# Patient Record
Sex: Male | Born: 1945 | Race: White | Hispanic: No | Marital: Married | Smoking: Never smoker
Health system: Southern US, Community
[De-identification: ages and names within clinical notes are randomized; demographics above are authoritative.]

## PROBLEM LIST (undated history)

## (undated) DIAGNOSIS — F32A Depression, unspecified: Secondary | ICD-10-CM

## (undated) DIAGNOSIS — I10 Essential (primary) hypertension: Secondary | ICD-10-CM

## (undated) DIAGNOSIS — E119 Type 2 diabetes mellitus without complications: Secondary | ICD-10-CM

## (undated) DIAGNOSIS — F329 Major depressive disorder, single episode, unspecified: Secondary | ICD-10-CM

## (undated) DIAGNOSIS — I219 Acute myocardial infarction, unspecified: Secondary | ICD-10-CM

## (undated) DIAGNOSIS — J45909 Unspecified asthma, uncomplicated: Secondary | ICD-10-CM

## (undated) HISTORY — DX: Depression, unspecified: F32.A

## (undated) HISTORY — PX: HERNIA REPAIR: SHX51

## (undated) HISTORY — DX: Essential (primary) hypertension: I10

## (undated) HISTORY — DX: Major depressive disorder, single episode, unspecified: F32.9

## (undated) HISTORY — DX: Acute myocardial infarction, unspecified: I21.9

## (undated) HISTORY — DX: Type 2 diabetes mellitus without complications: E11.9

## (undated) HISTORY — DX: Unspecified asthma, uncomplicated: J45.909

## (undated) HISTORY — PX: CORONARY ARTERY BYPASS GRAFT: SHX141

---

## 2010-11-03 ENCOUNTER — Encounter (INDEPENDENT_AMBULATORY_CARE_PROVIDER_SITE_OTHER): Payer: PRIVATE HEALTH INSURANCE

## 2010-11-03 DIAGNOSIS — I2510 Atherosclerotic heart disease of native coronary artery without angina pectoris: Secondary | ICD-10-CM

## 2010-11-03 DIAGNOSIS — I712 Thoracic aortic aneurysm, without rupture: Secondary | ICD-10-CM

## 2010-11-14 DIAGNOSIS — I712 Thoracic aortic aneurysm, without rupture: Secondary | ICD-10-CM

## 2010-11-14 DIAGNOSIS — I359 Nonrheumatic aortic valve disorder, unspecified: Secondary | ICD-10-CM

## 2010-11-14 DIAGNOSIS — I2510 Atherosclerotic heart disease of native coronary artery without angina pectoris: Secondary | ICD-10-CM

## 2010-12-27 ENCOUNTER — Encounter (INDEPENDENT_AMBULATORY_CARE_PROVIDER_SITE_OTHER): Payer: PRIVATE HEALTH INSURANCE

## 2010-12-27 DIAGNOSIS — I712 Thoracic aortic aneurysm, without rupture, unspecified: Secondary | ICD-10-CM

## 2010-12-27 DIAGNOSIS — I2510 Atherosclerotic heart disease of native coronary artery without angina pectoris: Secondary | ICD-10-CM

## 2010-12-28 NOTE — Assessment & Plan Note (Unsigned)
[facility]IS[facility]  Quindell, Shere Nicklos DOB:  01/21/1946                                        December 27, 2010 CHART #:  [id number]  We saw this patient in the office today following his dental procedure and CABG.  The patient is doing well.  His sternum is stable.  His wounds are well healed.  He is feeling well.  He has been a little bit weak, but has been gradually improving every day.  He has seen the cardiologist and they are happy with the progress.  He has been walking for exercise at home.  He does plan to return to work after 3 months and he was counseled to continue following his sternal precautions until that time limiting his weight lifting to 15 pounds.  He will return to see [place] on a p.r.n. basis and he will continue to follow up with the cardiologist.  Tera Mater. Arvilla Market, MD  BC/MEDQ  D:  12/27/2010  T:  12/28/2010  Job:  [id number]

## 2011-01-29 ENCOUNTER — Encounter (INDEPENDENT_AMBULATORY_CARE_PROVIDER_SITE_OTHER): Payer: PRIVATE HEALTH INSURANCE

## 2011-01-29 DIAGNOSIS — I2510 Atherosclerotic heart disease of native coronary artery without angina pectoris: Secondary | ICD-10-CM
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
## 2015-04-07 DIAGNOSIS — I4892 Unspecified atrial flutter: Secondary | ICD-10-CM | POA: Insufficient documentation

## 2015-04-07 DIAGNOSIS — I10 Essential (primary) hypertension: Secondary | ICD-10-CM | POA: Insufficient documentation

## 2015-04-07 DIAGNOSIS — Z952 Presence of prosthetic heart valve: Secondary | ICD-10-CM | POA: Insufficient documentation

## 2015-09-09 ENCOUNTER — Ambulatory Visit (INDEPENDENT_AMBULATORY_CARE_PROVIDER_SITE_OTHER): Payer: Worker's Compensation | Admitting: Family Medicine

## 2015-09-09 ENCOUNTER — Ambulatory Visit: Payer: Worker's Compensation

## 2015-09-09 VITALS — BP 120/70 | HR 68 | Temp 97.8°F | Resp 18 | Ht 72.5 in | Wt 225.0 lb

## 2015-09-09 DIAGNOSIS — S3210XA Unspecified fracture of sacrum, initial encounter for closed fracture: Secondary | ICD-10-CM

## 2015-09-09 DIAGNOSIS — M25552 Pain in left hip: Secondary | ICD-10-CM

## 2015-09-09 DIAGNOSIS — M533 Sacrococcygeal disorders, not elsewhere classified: Secondary | ICD-10-CM | POA: Diagnosis not present

## 2015-09-09 DIAGNOSIS — S322XXA Fracture of coccyx, initial encounter for closed fracture: Secondary | ICD-10-CM

## 2015-09-09 MED ORDER — OXYCODONE-ACETAMINOPHEN 7.5-325 MG PO TABS: 1.0000 | ORAL_TABLET | ORAL | Status: DC | PRN

## 2015-09-09 MED ORDER — CYCLOBENZAPRINE HCL 5 MG PO TABS: 5.0000 mg | ORAL_TABLET | Freq: Three times a day (TID) | ORAL | Status: AC | PRN

## 2015-09-09 NOTE — Patient Instructions (Addendum)
Your x-ray is very suspicious for a fracture of the coccyx. These usually takes several weeks to heal and can be very difficult pain wise. I'm referring her to an orthopedic surgeon to see what other measures we can take besides pain medicine which I am prescribing today.  In addition there are some changes of your right hip that need further evaluation by your primary care provider.

## 2015-09-09 NOTE — Progress Notes (Signed)
 @  By signing my name below, I, William Terrell, attest that this documentation has been prepared under the direction and in the presence of Elvina Sidle, MD.  Electronically Signed: Andrew Au, ED Scribe. 09/09/2015. 1:37 PM.  Patient ID: William Terrell MRN: [id number], DOB: 03/10/1946, 70 y.o. Date of Encounter: 09/09/2015, 1:37 PM  Primary Physician: PROVIDER NOT IN SYSTEM  Chief Complaint:  Chief Complaint  Patient presents with  . Back Pain    fall from truck  . Hip Pain    HPI: 70 y.o. year old male with history below presents with an injury that occurred at work yesterday. Pt states his right foot slipped while getting out of his truck cabin yesterday causing him to fall and landed on his left hip. He now has left hip pain and low back pain described as soreness. He reports hx of back surgery. He denies leg weakness or numbness.   PCP-Dr. Alben Spittle.   Past Medical History  Diagnosis Date  . Asthma   . Depression   . Myocardial infarction [facility])      Home Meds: Prior to Admission medications   Not on File    Allergies:  Allergies  Allergen Reactions  . Oxycodone Other (See Comments)    Hallucinations    Social History   Social History  . Marital Status: Married    Spouse Name: N/A  . Number of Children: N/A  . Years of Education: N/A   Occupational History  . Not on file.   Social History Main Topics  . Smoking status: Never Smoker   . Smokeless tobacco: Not on file  . Alcohol Use: Not on file  . Drug Use: Not on file  . Sexual Activity: Not on file   Other Topics Concern  . Not on file   Social History Narrative  . No narrative on file     Review of Systems: Constitutional: negative for chills, fever, night sweats, weight changes, or fatigue  HEENT: negative for vision changes, hearing loss, congestion, rhinorrhea, ST, epistaxis, or sinus pressure Cardiovascular: negative for chest pain or palpitations Respiratory: negative for  hemoptysis, wheezing, shortness of breath, or cough Abdominal: negative for abdominal pain, nausea, vomiting, diarrhea, or constipation Dermatological: negative for rash Neurologic: negative for headache, dizziness, or syncope All other systems reviewed and are otherwise negative with the exception to those above and in the HPI.   Physical Exam: Blood pressure 120/70, pulse 68, temperature 97.8 F (36.6 C), resp. rate 18, height 6' 0.5" (1.842 m), weight 225 lb (102.059 kg), SpO2 98 %., Body mass index is 30.08 kg/(m^2). General: Well developed, well nourished, in no acute distress. Head: Normocephalic, atraumatic, eyes without discharge, sclera non-icteric, nares are without discharge. Bilateral auditory canals clear, TM's are without perforation, pearly grey and translucent with reflective cone of light bilaterally. Oral cavity moist, posterior pharynx without exudate, erythema, peritonsillar abscess, or post nasal drip.  Neck: Supple. No thyromegaly. Full ROM. No lymphadenopathy. Lungs: Clear bilaterally to auscultation without wheezes, rales, or rhonchi. Breathing is unlabored. Heart: RRR with S1 S2. No murmurs, rubs, or gallops appreciated. Abdomen: Soft, non-tender, non-distended with normoactive bowel sounds. No hepatomegaly. No rebound/guarding. No obvious abdominal masses. Msk:  Strength and tone normal for age. Extremities/Skin: Warm and dry. No clubbing or cyanosis. No edema. No rashes or suspicious lesions. There is some swelling of the left gluteal cleft without ecchymosis Neuro: Alert and oriented X 3. Moves all extremities spontaneously. Gait is normal. CNII-XII grossly in tact. Psych:  Responds to questions appropriately with a normal affect.   [facility] reading (PRIMARY) by Dr. Milus Glazier Left hip: degenerative changes. No fx seen on the trochanter or acetabulum. Cystic changes in the right femoral head.  Pelvis: no fx seen in the pelvis or sacrum.   ASSESSMENT AND PLAN:  70  y.o. 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    ICD-9-CM ICD-10-CM   1. Sacral pain 724.6 M53.3 DG HIP UNILAT W OR W/O PELVIS 2-3 VIEWS LEFT     cyclobenzaprine (FLEXERIL) 5 MG tablet     Ambulatory referral to Orthopedic Surgery     CANCELED: DG Pelvis 1-2 Views     DISCONTINUED: oxyCODONE-acetaminophen (PERCOCET) 7.5-325 MG tablet  2. Left hip pain 719.45 M25.552 DG HIP UNILAT W OR W/O PELVIS 2-3 VIEWS LEFT     cyclobenzaprine (FLEXERIL) 5 MG tablet     Ambulatory referral to Orthopedic Surgery     CANCELED: DG Pelvis 1-2 Views     DISCONTINUED: oxyCODONE-acetaminophen (PERCOCET) 7.5-325 MG tablet  3. Sacrum and coccyx fracture, closed, initial encounter (HCC) 805.6 S32.10XA Ambulatory referral to Orthopedic Surgery    S32.2XXA cyclobenzaprine (FLEXERIL) 5 MG tablet     Ambulatory referral to Orthopedic Surgery     DISCONTINUED: oxyCODONE-acetaminophen (PERCOCET) 7.5-325 MG tablet      Signed, Elvina Sidle, MD 09/09/2015 1:37 PM

## 2015-09-09 NOTE — Addendum Note (Signed)
Addended by: Danelle Berry B on: 09/09/2015 01:44 PM   Modules accepted: Medications

## 2016-04-04 DIAGNOSIS — I5021 Acute systolic (congestive) heart failure: Secondary | ICD-10-CM | POA: Insufficient documentation

## 2017-06-28 DIAGNOSIS — E119 Type 2 diabetes mellitus without complications: Secondary | ICD-10-CM | POA: Insufficient documentation

## 2017-07-04 IMAGING — CR DG HIP (WITH OR WITHOUT PELVIS) 2-3V*L*
3 series · 3 of 3 positions shown · non-contrast
Comparison: None.

CLINICAL DATA: Left hip pain

EXAM:
DG HIP (WITH OR WITHOUT PELVIS) 2-3V LEFT

[AP (1 of 2)]
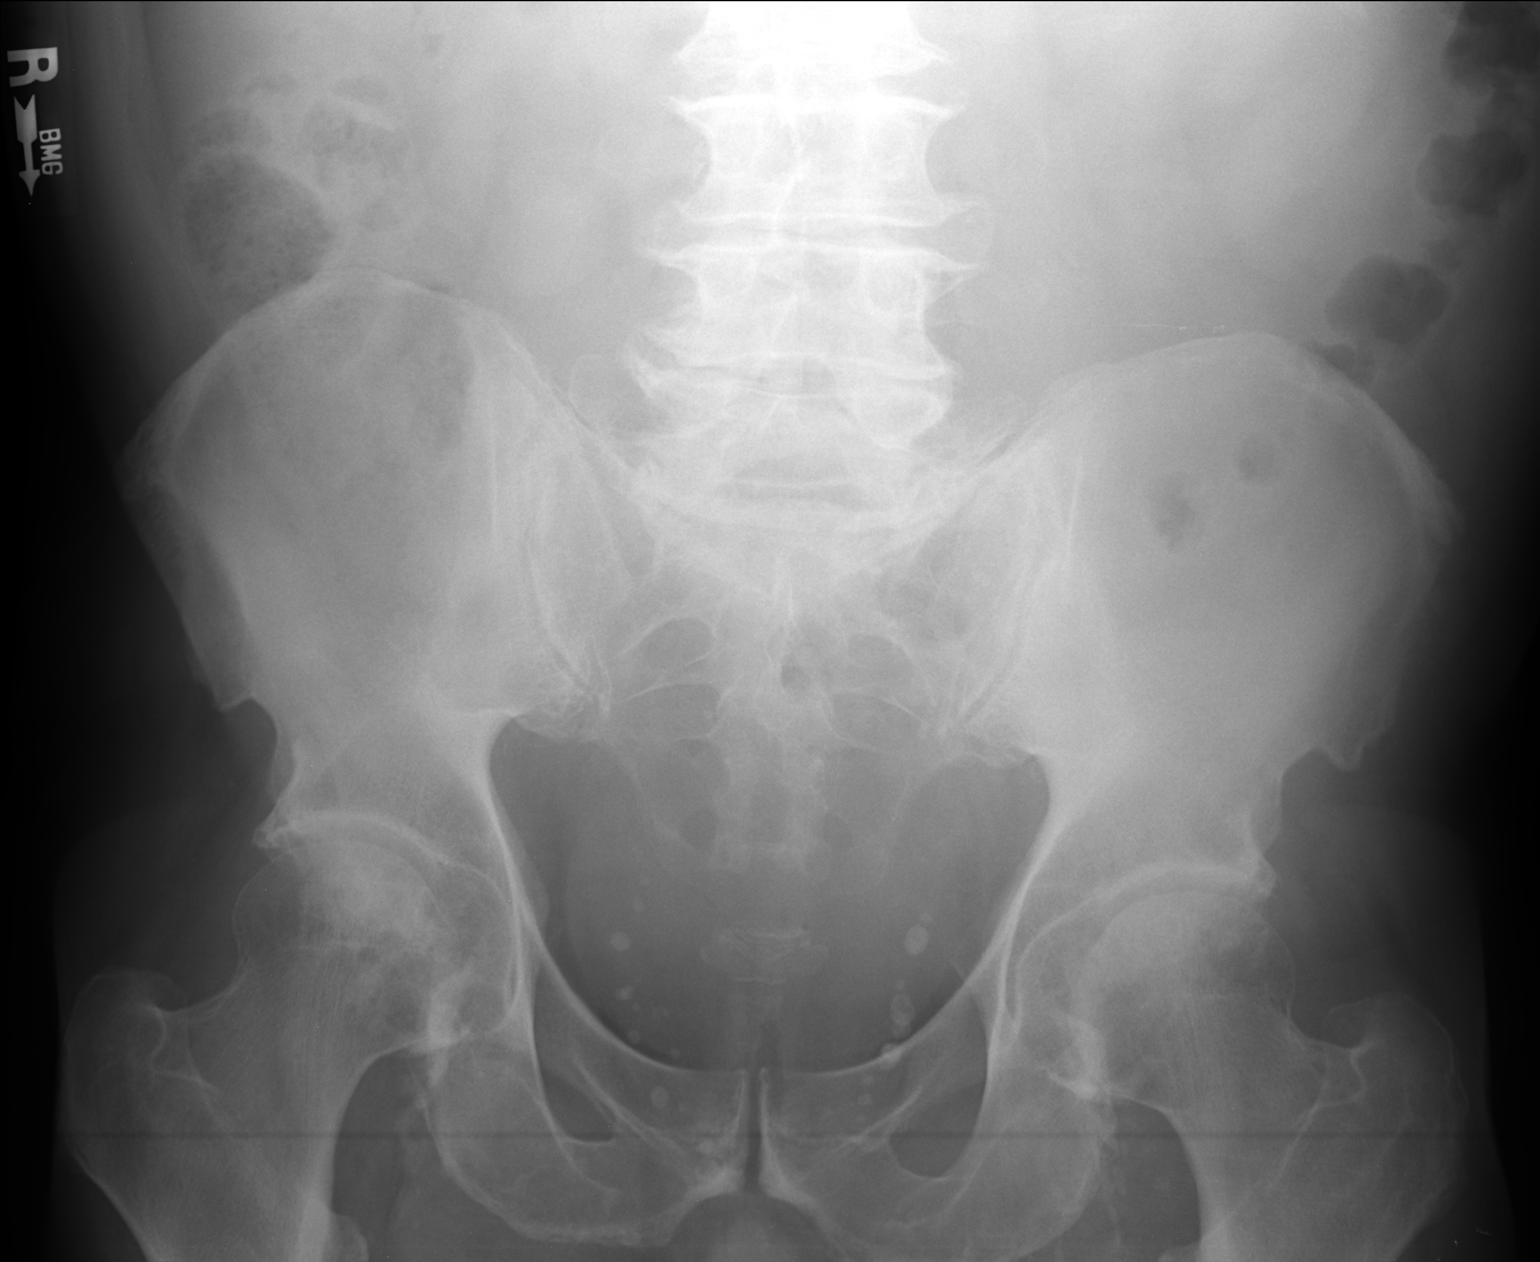

[AP (2 of 2)]
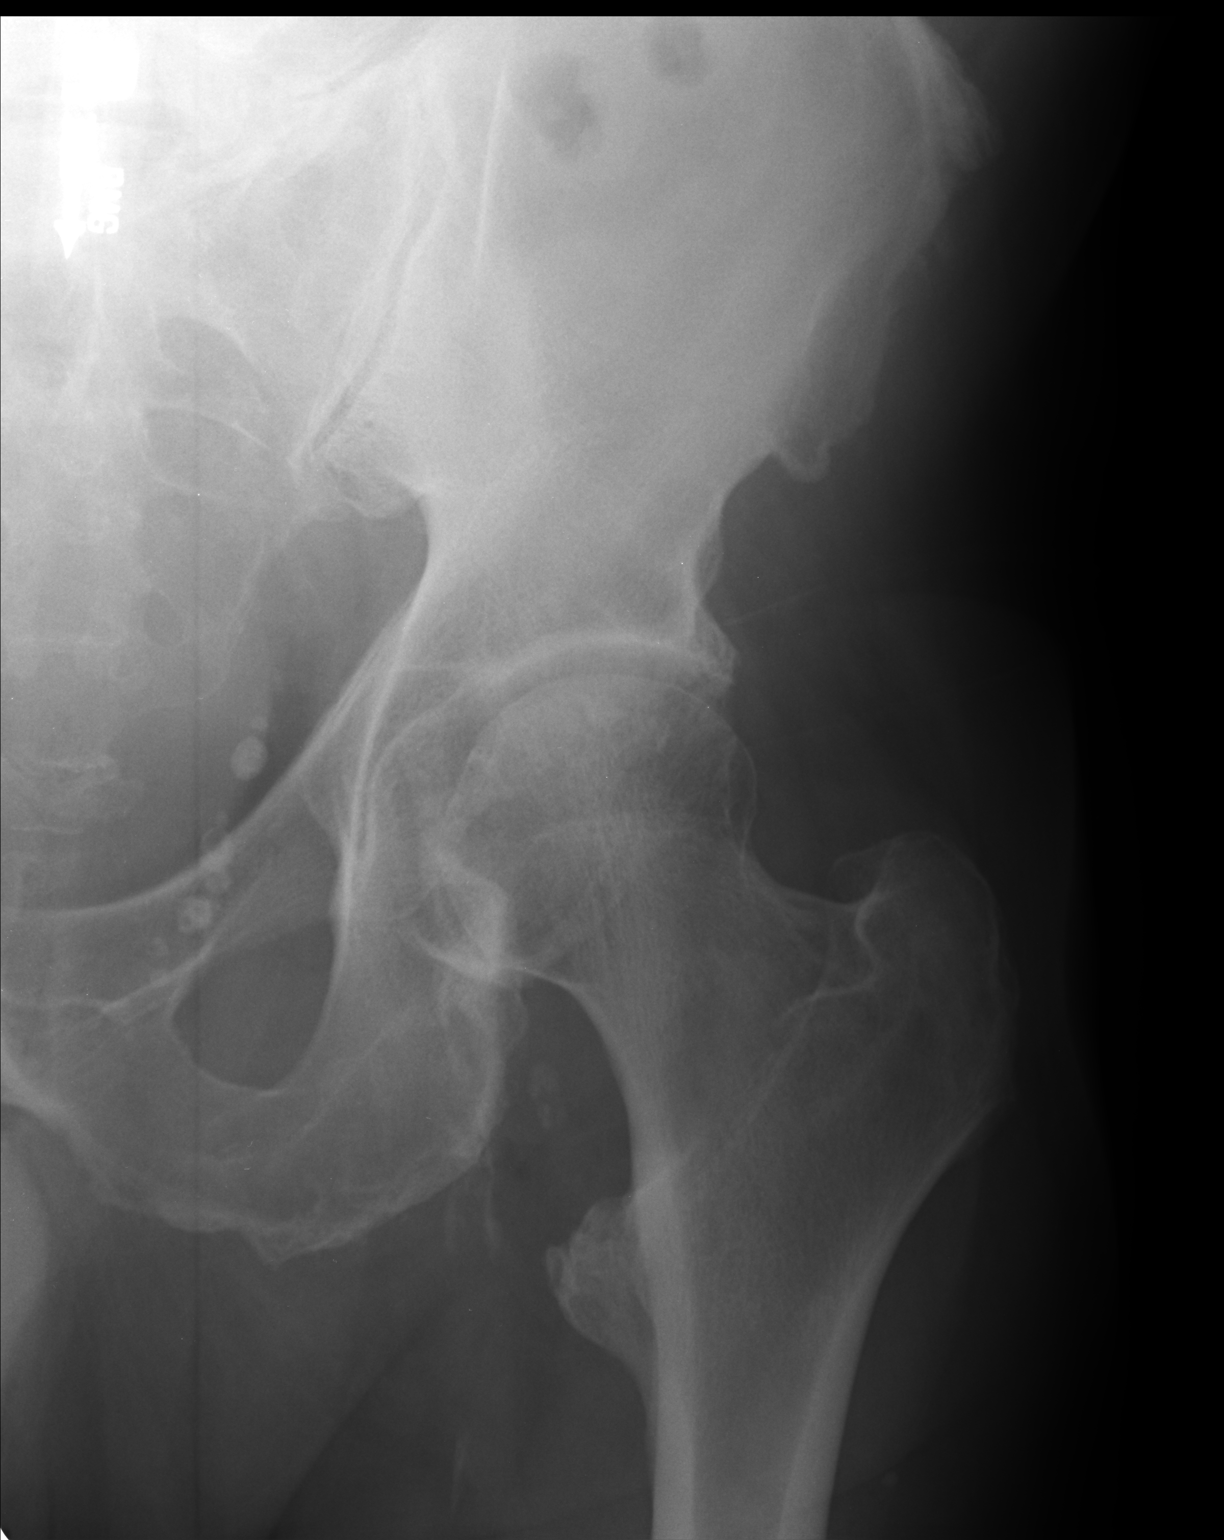

[lateral]
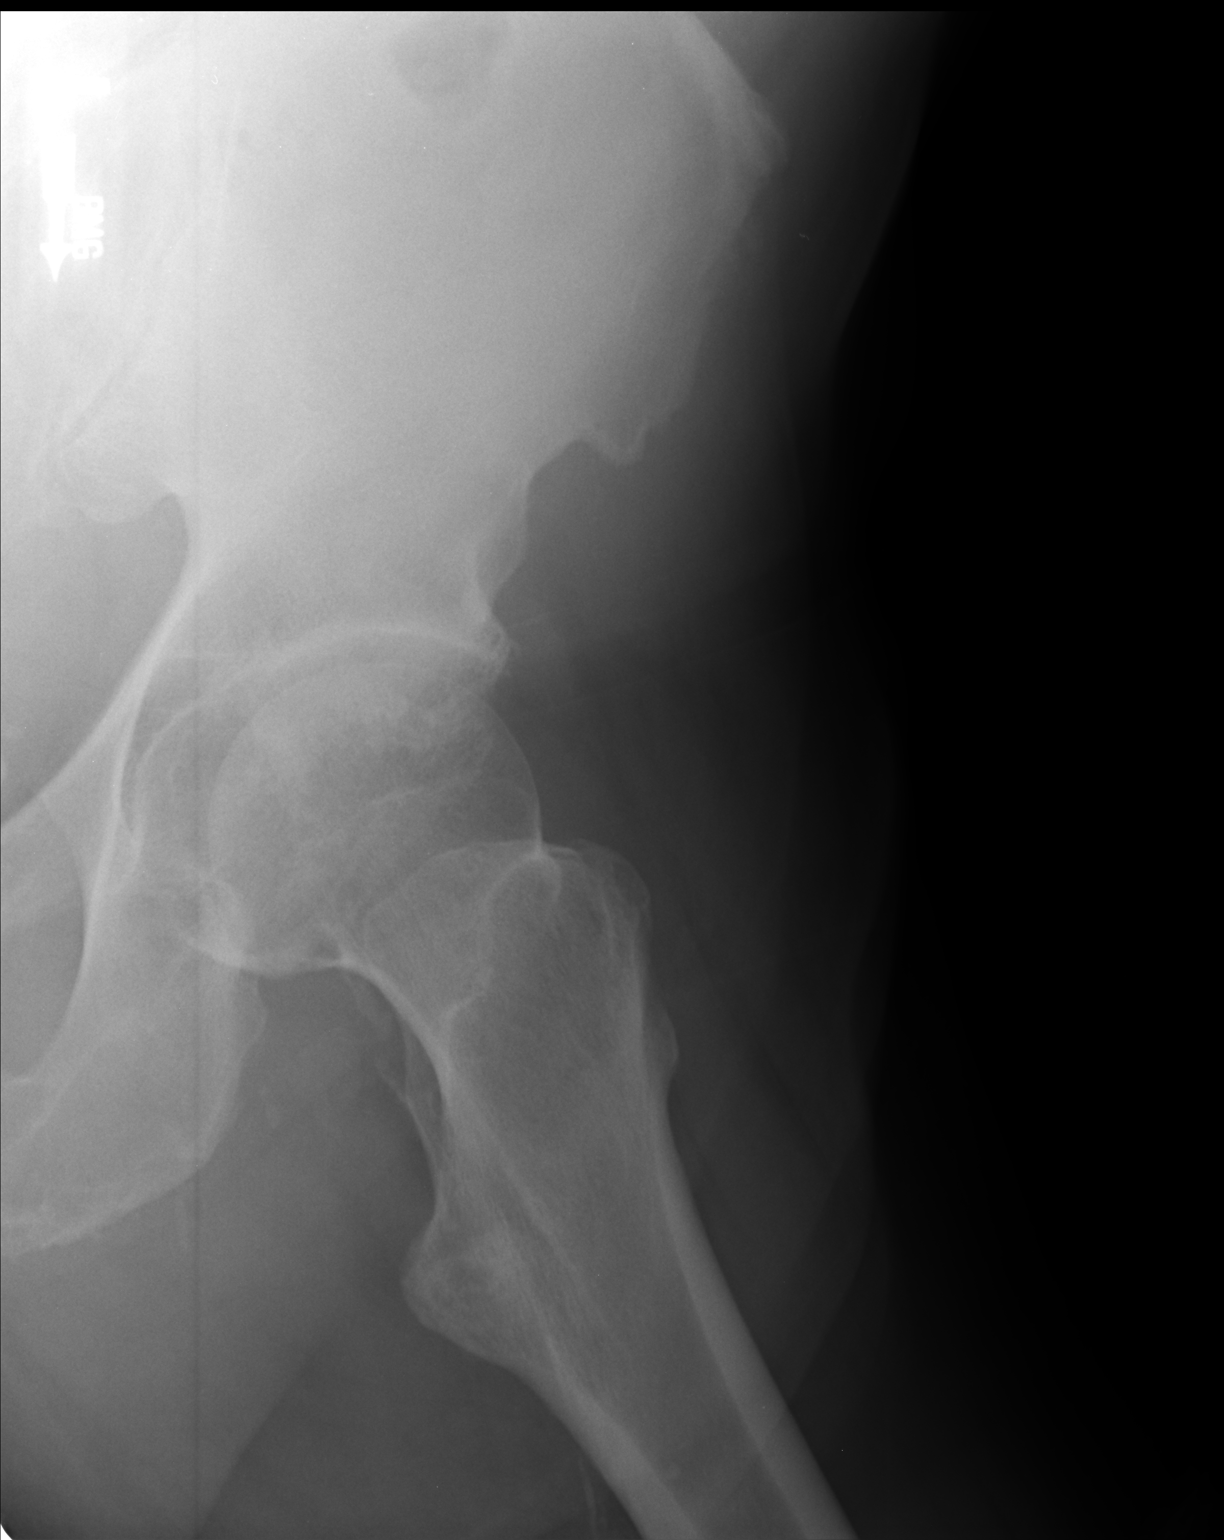

[3 of 3 positions shown; findings below may reference images not displayed]

FINDINGS: Normal alignment. Negative for fracture. Negative for AVN. Left hip
joint space is normal. No significant degenerative change.

Mild lumbar degenerative change. Mild degenerative change right hip
joint.
IMPRESSION: Negative left hip.

## 2018-10-30 DIAGNOSIS — Z7901 Long term (current) use of anticoagulants: Secondary | ICD-10-CM | POA: Diagnosis not present

## 2018-10-30 DIAGNOSIS — I4892 Unspecified atrial flutter: Secondary | ICD-10-CM | POA: Diagnosis not present

## 2018-10-30 DIAGNOSIS — Z5181 Encounter for therapeutic drug level monitoring: Secondary | ICD-10-CM | POA: Diagnosis not present

## 2018-11-27 DIAGNOSIS — I4892 Unspecified atrial flutter: Secondary | ICD-10-CM | POA: Diagnosis not present

## 2018-11-27 DIAGNOSIS — Z7901 Long term (current) use of anticoagulants: Secondary | ICD-10-CM | POA: Diagnosis not present

## 2018-11-27 DIAGNOSIS — Z5181 Encounter for therapeutic drug level monitoring: Secondary | ICD-10-CM | POA: Diagnosis not present

## 2018-12-15 ENCOUNTER — Other Ambulatory Visit: Payer: Self-pay

## 2018-12-15 NOTE — Patient Outreach (Signed)
  [facility]) Care Management Chronic Special Needs Program  12/15/2018  Name: William Terrell DOB: 20-Feb-1946  MRN: [id number]  [name] is enrolled in a chronic special needs plan for Diabetes. Chronic Care Management Coordinator telephoned client to review health risk assessment and to develop individualized care plan.  Introduced the chronic care management program, importance of client participation, and taking their care plan to all provider appointments and inpatient facilities.  Reviewed the transition of care process and possible referral to community care management.  Subjective: Client states he is working on getting his diabetes under better control.  States he does struggle with eating the right portion sizes of bread and pasta.  States his heart is not a problem and he takes his Warfarin as he is told to take.  States his blood sugars range from 120-180 most mornings.  States several of his doctor visits have been rescheduled due to Covid  Goals Addressed            This Visit's Progress   . Client understands the importance of follow-up with providers by attending scheduled visits      . Client verbalize knowledge of Heart Failure disease self management skills by next 6 months      . Client will report no worsening of symptoms of Atrial Fibrillation within the next 6 months      . Client will use Assistive Devices as needed and verbalize understanding of device use      . Client will verbalize knowledge of self management of Hypertension as evidences by BP reading of 140/90 or less; or as defined by provider      . HEMOGLOBIN A1C < 7.0       Diabetes self management actions:  Glucose monitoring per provider recommendations  Eat Healthy  Check feet daily  Visit provider every 3-6 months as directed  Hbg A1C level every 3-6 months.  Eye Exam yearly    . Maintain timely refills of diabetic medication as prescribed within the year .      Marland Kitchen  Obtain annual  Lipid Profile, LDL-C      . Obtain Annual Eye (retinal)  Exam       . Obtain Annual Foot Exam      . Obtain annual screen for micro albuminuria (urine) , nephropathy (kidney problems)      . Obtain Hemoglobin A1C at least 2 times per year      . Visit Primary Care Provider or Endocrinologist at least 2 times per year        Client is not meeting diabetes self management goal of hemoglobin A1C of <7% with last reading with last result 7.8%.  Client is seen regularly for INR checks for his Warfarin.  Reports trying to follow lower CHO diet but has difficulty with portion sizes of bread and pasta.  Reviewed CHO counting and portion sizes Encouraged to increase his activity as tolerated Plan:  Send successful outreach letter with a copy of their individualized care plan, Send individual care plan to provider and Send educational material  Chronic care management coordination will outreach in:  6 Months     Dudley Major RN, [employer], CDE Chronic Care Management Coordinator [facility] Care Management [phone]

## 2018-12-25 DIAGNOSIS — Z5181 Encounter for therapeutic drug level monitoring: Secondary | ICD-10-CM | POA: Diagnosis not present

## 2018-12-25 DIAGNOSIS — Z7901 Long term (current) use of anticoagulants: Secondary | ICD-10-CM | POA: Diagnosis not present

## 2018-12-25 DIAGNOSIS — I4892 Unspecified atrial flutter: Secondary | ICD-10-CM | POA: Diagnosis not present

## 2019-01-22 DIAGNOSIS — I4892 Unspecified atrial flutter: Secondary | ICD-10-CM | POA: Diagnosis not present

## 2019-01-22 DIAGNOSIS — R791 Abnormal coagulation profile: Secondary | ICD-10-CM | POA: Diagnosis not present

## 2019-01-22 DIAGNOSIS — Z5181 Encounter for therapeutic drug level monitoring: Secondary | ICD-10-CM | POA: Diagnosis not present

## 2019-01-22 DIAGNOSIS — Z7901 Long term (current) use of anticoagulants: Secondary | ICD-10-CM | POA: Diagnosis not present

## 2019-02-03 DIAGNOSIS — Z5181 Encounter for therapeutic drug level monitoring: Secondary | ICD-10-CM | POA: Diagnosis not present

## 2019-02-03 DIAGNOSIS — I483 Typical atrial flutter: Secondary | ICD-10-CM | POA: Diagnosis not present

## 2019-02-03 DIAGNOSIS — Z7901 Long term (current) use of anticoagulants: Secondary | ICD-10-CM | POA: Diagnosis not present

## 2019-02-03 DIAGNOSIS — I4892 Unspecified atrial flutter: Secondary | ICD-10-CM | POA: Diagnosis not present

## 2019-02-03 DIAGNOSIS — R791 Abnormal coagulation profile: Secondary | ICD-10-CM | POA: Diagnosis not present

## 2019-02-04 DIAGNOSIS — Z7984 Long term (current) use of oral hypoglycemic drugs: Secondary | ICD-10-CM | POA: Diagnosis not present

## 2019-02-04 DIAGNOSIS — H34811 Central retinal vein occlusion, right eye, with macular edema: Secondary | ICD-10-CM | POA: Diagnosis not present

## 2019-02-04 DIAGNOSIS — E119 Type 2 diabetes mellitus without complications: Secondary | ICD-10-CM | POA: Diagnosis not present

## 2019-02-04 DIAGNOSIS — H35033 Hypertensive retinopathy, bilateral: Secondary | ICD-10-CM | POA: Diagnosis not present

## 2019-02-18 DIAGNOSIS — E119 Type 2 diabetes mellitus without complications: Secondary | ICD-10-CM | POA: Diagnosis not present

## 2019-02-18 DIAGNOSIS — I4892 Unspecified atrial flutter: Secondary | ICD-10-CM | POA: Diagnosis not present

## 2019-02-18 DIAGNOSIS — Z5181 Encounter for therapeutic drug level monitoring: Secondary | ICD-10-CM | POA: Diagnosis not present

## 2019-02-18 DIAGNOSIS — Z7901 Long term (current) use of anticoagulants: Secondary | ICD-10-CM | POA: Diagnosis not present

## 2019-02-18 DIAGNOSIS — H3581 Retinal edema: Secondary | ICD-10-CM | POA: Diagnosis not present

## 2019-02-18 DIAGNOSIS — H34811 Central retinal vein occlusion, right eye, with macular edema: Secondary | ICD-10-CM | POA: Diagnosis not present

## 2019-02-18 DIAGNOSIS — Z7984 Long term (current) use of oral hypoglycemic drugs: Secondary | ICD-10-CM | POA: Diagnosis not present

## 2019-02-18 DIAGNOSIS — H35351 Cystoid macular degeneration, right eye: Secondary | ICD-10-CM | POA: Diagnosis not present

## 2019-02-18 DIAGNOSIS — H35013 Changes in retinal vascular appearance, bilateral: Secondary | ICD-10-CM | POA: Diagnosis not present

## 2019-03-18 DIAGNOSIS — Z7901 Long term (current) use of anticoagulants: Secondary | ICD-10-CM | POA: Diagnosis not present

## 2019-03-18 DIAGNOSIS — Z5181 Encounter for therapeutic drug level monitoring: Secondary | ICD-10-CM | POA: Diagnosis not present

## 2019-03-18 DIAGNOSIS — I4892 Unspecified atrial flutter: Secondary | ICD-10-CM | POA: Diagnosis not present

## 2019-03-25 DIAGNOSIS — H2513 Age-related nuclear cataract, bilateral: Secondary | ICD-10-CM | POA: Diagnosis not present

## 2019-03-25 DIAGNOSIS — H35351 Cystoid macular degeneration, right eye: Secondary | ICD-10-CM | POA: Diagnosis not present

## 2019-03-25 DIAGNOSIS — H34811 Central retinal vein occlusion, right eye, with macular edema: Secondary | ICD-10-CM | POA: Diagnosis not present

## 2019-03-26 DIAGNOSIS — I4892 Unspecified atrial flutter: Secondary | ICD-10-CM | POA: Diagnosis not present

## 2019-03-26 DIAGNOSIS — E119 Type 2 diabetes mellitus without complications: Secondary | ICD-10-CM | POA: Diagnosis not present

## 2019-03-26 DIAGNOSIS — Z0000 Encounter for general adult medical examination without abnormal findings: Secondary | ICD-10-CM | POA: Diagnosis not present

## 2019-03-26 DIAGNOSIS — I4891 Unspecified atrial fibrillation: Secondary | ICD-10-CM | POA: Diagnosis not present

## 2019-03-26 DIAGNOSIS — Z7984 Long term (current) use of oral hypoglycemic drugs: Secondary | ICD-10-CM | POA: Diagnosis not present

## 2019-03-26 DIAGNOSIS — I2510 Atherosclerotic heart disease of native coronary artery without angina pectoris: Secondary | ICD-10-CM | POA: Diagnosis not present

## 2019-04-15 DIAGNOSIS — Z7901 Long term (current) use of anticoagulants: Secondary | ICD-10-CM | POA: Diagnosis not present

## 2019-04-15 DIAGNOSIS — I4892 Unspecified atrial flutter: Secondary | ICD-10-CM | POA: Diagnosis not present

## 2019-04-15 DIAGNOSIS — Z5181 Encounter for therapeutic drug level monitoring: Secondary | ICD-10-CM | POA: Diagnosis not present

## 2019-04-23 DIAGNOSIS — Z23 Encounter for immunization: Secondary | ICD-10-CM | POA: Diagnosis not present

## 2019-04-29 DIAGNOSIS — I252 Old myocardial infarction: Secondary | ICD-10-CM | POA: Diagnosis not present

## 2019-04-29 DIAGNOSIS — R002 Palpitations: Secondary | ICD-10-CM | POA: Diagnosis not present

## 2019-04-29 DIAGNOSIS — Z952 Presence of prosthetic heart valve: Secondary | ICD-10-CM | POA: Diagnosis not present

## 2019-04-29 DIAGNOSIS — I483 Typical atrial flutter: Secondary | ICD-10-CM | POA: Diagnosis not present

## 2019-04-30 DIAGNOSIS — Z7984 Long term (current) use of oral hypoglycemic drugs: Secondary | ICD-10-CM | POA: Diagnosis not present

## 2019-04-30 DIAGNOSIS — Z952 Presence of prosthetic heart valve: Secondary | ICD-10-CM | POA: Diagnosis not present

## 2019-04-30 DIAGNOSIS — E119 Type 2 diabetes mellitus without complications: Secondary | ICD-10-CM | POA: Diagnosis not present

## 2019-04-30 DIAGNOSIS — I252 Old myocardial infarction: Secondary | ICD-10-CM | POA: Diagnosis not present

## 2019-04-30 DIAGNOSIS — H35033 Hypertensive retinopathy, bilateral: Secondary | ICD-10-CM | POA: Diagnosis not present

## 2019-04-30 DIAGNOSIS — R9431 Abnormal electrocardiogram [ECG] [EKG]: Secondary | ICD-10-CM | POA: Diagnosis not present

## 2019-04-30 DIAGNOSIS — H53462 Homonymous bilateral field defects, left side: Secondary | ICD-10-CM | POA: Diagnosis not present

## 2019-04-30 DIAGNOSIS — H34811 Central retinal vein occlusion, right eye, with macular edema: Secondary | ICD-10-CM | POA: Diagnosis not present

## 2019-04-30 DIAGNOSIS — H25813 Combined forms of age-related cataract, bilateral: Secondary | ICD-10-CM | POA: Diagnosis not present

## 2019-04-30 DIAGNOSIS — H2513 Age-related nuclear cataract, bilateral: Secondary | ICD-10-CM | POA: Diagnosis not present

## 2019-05-13 DIAGNOSIS — Z7901 Long term (current) use of anticoagulants: Secondary | ICD-10-CM | POA: Diagnosis not present

## 2019-05-13 DIAGNOSIS — I483 Typical atrial flutter: Secondary | ICD-10-CM | POA: Diagnosis not present

## 2019-05-13 DIAGNOSIS — Z5181 Encounter for therapeutic drug level monitoring: Secondary | ICD-10-CM | POA: Diagnosis not present

## 2019-05-19 ENCOUNTER — Other Ambulatory Visit: Payer: Self-pay

## 2019-05-19 NOTE — Patient Outreach (Signed)
  [facility]) Care Management Chronic Special Needs Program  05/19/2019  Name: Eaton Folmar DOB: 01-Feb-1946  MRN: [id number]  Mr. Cleburn Maiolo is enrolled in a chronic special needs plan for Diabetes. Client called with no answer No answer and HIPAA compliant message left. 1st attempt Plan for 2nd outreach call in one week Chronic care management coordinator will attempt outreach in one week.   Peter Garter RN, Jackquline [place], CDE Chronic Care Management Coordinator [facility] Care Management [phone]

## 2019-05-22 ENCOUNTER — Other Ambulatory Visit: Payer: Self-pay

## 2019-05-22 NOTE — Patient Outreach (Signed)
  [facility]) Care Management Chronic Special Needs Program  05/22/2019  Name: [name] DOB: 1946-06-12  MRN: [id number]  [name] is enrolled in a chronic special needs plan for Diabetes. Reviewed and updated care plan.  Subjective: Client states he has been doing good.  States his blood sugars are sometimes higher in the morning.  Reports it was 180 this morning.  States he had homemade soup and chips for dinner.  Denies any chest pains or shortness of breath.  States he goes regularly to have his Warfarin checked.     Goals Addressed            This Visit's Progress   . Client understands the importance of follow-up with providers by attending scheduled visits   On track   . Client verbalize knowledge of Heart Failure disease self management skills by next 6 months(continue 05/22/19)   On track   . Client will report no worsening of symptoms of Atrial Fibrillation within the next 6 months(continue 05/22/19)   On track   . Client will use Assistive Devices as needed and verbalize understanding of device use   On track    [employer] approved meters-One Touch, Freestyle or Precision     . Client will verbalize knowledge of self management of Hypertension as evidences by BP reading of 140/90 or less; or as defined by provider   On track   . HEMOGLOBIN A1C < 7.0        Diabetes self management actions:  Glucose monitoring per provider recommendations  Eat Healthy  Check feet daily  Visit provider every 3-6 months as directed  Hbg A1C level every 3-6 months.  Eye Exam yearly    . Maintain timely refills of diabetic medication as prescribed within the year .   On track   . COMPLETED: Obtain annual  Lipid Profile, LDL-C       Completed 03/26/19    . COMPLETED: Obtain Annual Eye (retinal)  Exam        Completed 02/04/19    . COMPLETED: Obtain Annual Foot Exam       Completed 03/26/19    . COMPLETED: Obtain annual screen for micro  albuminuria (urine) , nephropathy (kidney problems)       Completed 03/26/19    . COMPLETED: Obtain Hemoglobin A1C at least 2 times per year       Completed 08/26/18, 03/26/19    . Visit Primary Care Provider or Endocrinologist at least 2 times per year        Visits 09/22/18, 03/26/19     Client is not meeting diabetes self management goal of hemoglobin A1C of <7% with last reading of 7.3% Reinforced to follow a low CHO low sodium diet and to watch his portion sizes.   Encouraged to continue to get regular exercise as tolerated Reviewed home safety and fall procautions Reviewed number for 24-hour nurse Line Reviewed COVID 19 precautions  Plan:  Send successful outreach letter with a copy of their individualized care plan and Send individual care plan to provider  Chronic care management coordinator will outreach in:  4-6 Months    [employer], [facility], [place] Management Coordinator [place] Management [phone]

## 2019-05-25 DIAGNOSIS — I517 Cardiomegaly: Secondary | ICD-10-CM | POA: Diagnosis not present

## 2019-05-27 DIAGNOSIS — H35351 Cystoid macular degeneration, right eye: Secondary | ICD-10-CM | POA: Diagnosis not present

## 2019-05-27 DIAGNOSIS — H3561 Retinal hemorrhage, right eye: Secondary | ICD-10-CM | POA: Diagnosis not present

## 2019-05-27 DIAGNOSIS — H35033 Hypertensive retinopathy, bilateral: Secondary | ICD-10-CM | POA: Diagnosis not present

## 2019-05-27 DIAGNOSIS — H34811 Central retinal vein occlusion, right eye, with macular edema: Secondary | ICD-10-CM | POA: Diagnosis not present

## 2019-06-09 ENCOUNTER — Ambulatory Visit: Payer: PRIVATE HEALTH INSURANCE

## 2019-06-17 DIAGNOSIS — Z7901 Long term (current) use of anticoagulants: Secondary | ICD-10-CM | POA: Diagnosis not present

## 2019-06-17 DIAGNOSIS — Z5181 Encounter for therapeutic drug level monitoring: Secondary | ICD-10-CM | POA: Diagnosis not present

## 2019-06-17 DIAGNOSIS — I483 Typical atrial flutter: Secondary | ICD-10-CM | POA: Diagnosis not present

## 2019-06-24 DIAGNOSIS — H34811 Central retinal vein occlusion, right eye, with macular edema: Secondary | ICD-10-CM | POA: Diagnosis not present

## 2019-06-24 DIAGNOSIS — H3554 Dystrophies primarily involving the retinal pigment epithelium: Secondary | ICD-10-CM | POA: Diagnosis not present

## 2019-06-24 DIAGNOSIS — H35033 Hypertensive retinopathy, bilateral: Secondary | ICD-10-CM | POA: Diagnosis not present

## 2019-06-24 DIAGNOSIS — H35372 Puckering of macula, left eye: Secondary | ICD-10-CM | POA: Diagnosis not present

## 2019-06-24 DIAGNOSIS — Z7689 Persons encountering health services in other specified circumstances: Secondary | ICD-10-CM | POA: Diagnosis not present

## 2019-06-24 DIAGNOSIS — E119 Type 2 diabetes mellitus without complications: Secondary | ICD-10-CM | POA: Diagnosis not present

## 2019-07-22 DIAGNOSIS — H34811 Central retinal vein occlusion, right eye, with macular edema: Secondary | ICD-10-CM | POA: Diagnosis not present

## 2019-07-22 DIAGNOSIS — H348111 Central retinal vein occlusion, right eye, with retinal neovascularization: Secondary | ICD-10-CM | POA: Diagnosis not present

## 2019-07-22 DIAGNOSIS — H5203 Hypermetropia, bilateral: Secondary | ICD-10-CM | POA: Diagnosis not present

## 2019-07-22 DIAGNOSIS — H52203 Unspecified astigmatism, bilateral: Secondary | ICD-10-CM | POA: Diagnosis not present

## 2019-07-22 DIAGNOSIS — Z7984 Long term (current) use of oral hypoglycemic drugs: Secondary | ICD-10-CM | POA: Diagnosis not present

## 2019-07-22 DIAGNOSIS — E119 Type 2 diabetes mellitus without complications: Secondary | ICD-10-CM | POA: Diagnosis not present

## 2019-07-22 DIAGNOSIS — H524 Presbyopia: Secondary | ICD-10-CM | POA: Diagnosis not present

## 2019-07-23 DIAGNOSIS — I483 Typical atrial flutter: Secondary | ICD-10-CM | POA: Diagnosis not present

## 2019-07-23 DIAGNOSIS — Z5181 Encounter for therapeutic drug level monitoring: Secondary | ICD-10-CM | POA: Diagnosis not present

## 2019-07-23 DIAGNOSIS — Z7901 Long term (current) use of anticoagulants: Secondary | ICD-10-CM | POA: Diagnosis not present

## 2019-07-28 DIAGNOSIS — I10 Essential (primary) hypertension: Secondary | ICD-10-CM | POA: Diagnosis not present

## 2019-07-28 DIAGNOSIS — D6489 Other specified anemias: Secondary | ICD-10-CM | POA: Diagnosis not present

## 2019-07-28 DIAGNOSIS — E785 Hyperlipidemia, unspecified: Secondary | ICD-10-CM | POA: Diagnosis not present

## 2019-07-28 DIAGNOSIS — E119 Type 2 diabetes mellitus without complications: Secondary | ICD-10-CM | POA: Diagnosis not present

## 2019-07-29 DIAGNOSIS — H3554 Dystrophies primarily involving the retinal pigment epithelium: Secondary | ICD-10-CM | POA: Diagnosis not present

## 2019-07-29 DIAGNOSIS — H35351 Cystoid macular degeneration, right eye: Secondary | ICD-10-CM | POA: Diagnosis not present

## 2019-07-29 DIAGNOSIS — H35033 Hypertensive retinopathy, bilateral: Secondary | ICD-10-CM | POA: Diagnosis not present

## 2019-07-29 DIAGNOSIS — H34811 Central retinal vein occlusion, right eye, with macular edema: Secondary | ICD-10-CM | POA: Diagnosis not present

## 2019-09-04 ENCOUNTER — Other Ambulatory Visit: Payer: Self-pay

## 2019-09-04 NOTE — Patient Outreach (Addendum)
  [facility]) Care Management Chronic Special Needs Program    09/04/2019  Name: William Terrell, DOB: 1946/01/25  MRN: [id number]   [name] is enrolled in a chronic special needs plan for Diabetes. Case closed as client has disenrolled from Chronic Special Needs Plan on 08/16/19 Plan to send case closure letter to [place] [employer] to notify client of dis-enrollment from plan  Dudley Major RN, [facility], CDE Chronic Care Management Coordinator [facility] Care Management [phone]

## 2019-09-24 ENCOUNTER — Ambulatory Visit: Payer: HMO
